# Patient Record
Sex: Male | Born: 2000
Health system: Southern US, Community
[De-identification: ages and names within clinical notes are randomized; demographics above are authoritative.]

## PROBLEM LIST (undated history)

## (undated) DIAGNOSIS — F639 Impulse disorder, unspecified: Secondary | ICD-10-CM

## (undated) DIAGNOSIS — R45851 Suicidal ideations: Secondary | ICD-10-CM

---
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
## 2018-07-09 ENCOUNTER — Emergency Department (HOSPITAL_COMMUNITY): Payer: Self-pay

## 2018-07-09 ENCOUNTER — Encounter (HOSPITAL_COMMUNITY): Payer: Self-pay | Admitting: *Deleted

## 2018-07-09 ENCOUNTER — Emergency Department (HOSPITAL_COMMUNITY)
Admission: EM | Admit: 2018-07-09 | Discharge: 2018-07-09 | Disposition: A | Discharge status: Home / Self Care | Payer: Self-pay | Attending: Emergency Medicine | Admitting: Emergency Medicine

## 2018-07-09 DIAGNOSIS — M546 Pain in thoracic spine: Secondary | ICD-10-CM | POA: Insufficient documentation

## 2018-07-09 DIAGNOSIS — F17210 Nicotine dependence, cigarettes, uncomplicated: Secondary | ICD-10-CM | POA: Insufficient documentation

## 2018-07-09 DIAGNOSIS — Z79899 Other long term (current) drug therapy: Secondary | ICD-10-CM | POA: Insufficient documentation

## 2018-07-09 MED ORDER — IBUPROFEN 800 MG PO TABS: 800.0000 mg | ORAL_TABLET | Freq: Three times a day (TID) | ORAL | 0 refills | Status: DC

## 2018-07-09 MED ORDER — METHOCARBAMOL 500 MG PO TABS: 500.0000 mg | ORAL_TABLET | Freq: Two times a day (BID) | ORAL | 0 refills | Status: DC
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
## 2018-07-09 NOTE — Discharge Instructions (Signed)
Take the prescribed medication as directed.  You may be sore for a few days which is normal.  Can use heat/ice as well to help with pain. Follow-up with your primary care doctor. Return to the ED for new or worsening symptoms.
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
## 2018-07-11 ENCOUNTER — Other Ambulatory Visit: Payer: Self-pay

## 2018-07-11 ENCOUNTER — Ambulatory Visit (HOSPITAL_COMMUNITY)
Admission: EM | Admit: 2018-07-11 | Discharge: 2018-07-11 | Disposition: A | Discharge status: Home / Self Care | Payer: Self-pay | Attending: Family Medicine | Admitting: Family Medicine

## 2018-07-11 ENCOUNTER — Encounter (HOSPITAL_COMMUNITY): Payer: Self-pay | Admitting: *Deleted

## 2018-07-11 DIAGNOSIS — M549 Dorsalgia, unspecified: Secondary | ICD-10-CM

## 2018-07-11 LAB — POCT I-STAT, CHEM 8
BUN: 18 mg/dL (ref 6–20)
CREATININE: 1.1 mg/dL (ref 0.61–1.24)
Calcium, Ion: 1.22 mmol/L (ref 1.15–1.40)
Chloride: 105 mmol/L (ref 98–111)
GLUCOSE: 74 mg/dL (ref 70–99)
HEMATOCRIT: 47 % (ref 39.0–52.0)
HEMOGLOBIN: 16 g/dL (ref 13.0–17.0)
Potassium: 4.1 mmol/L (ref 3.5–5.1)
Sodium: 139 mmol/L (ref 135–145)
TCO2: 23 mmol/L (ref 22–32)

## 2018-07-11 MED ORDER — KETOROLAC TROMETHAMINE 60 MG/2ML IM SOLN
INTRAMUSCULAR | Status: AC
  Filled 2018-07-11: qty 2

## 2018-07-11 MED ORDER — KETOROLAC TROMETHAMINE 60 MG/2ML IM SOLN
60.0000 mg | Freq: Once | INTRAMUSCULAR | Status: AC
  Administered 2018-07-11: 60 mg via INTRAMUSCULAR

## 2018-07-11 NOTE — Discharge Instructions (Signed)
Light and regular activity as tolerated. Avoid just laying or standing. Robaxin as needed as prescribed.  Ibuprofen regularly, don't take any additional for another 8 hours.  Limit heavy lifting for the next three days.  Please follow up with a primary care provider for persistent symptoms.  If develop increased pain, dizziness, abdominal pain, weakness, numbness or tingling please go to the Er.
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
## 2018-09-06 IMAGING — CR DG WRIST COMPLETE 3+V*R*
4 series · 4 of 4 positions shown · non-contrast
Comparison: None.

CLINICAL DATA: Fall on right hand playing basketball

EXAM:
RIGHT WRIST - COMPLETE 3+ VIEW

[wrist pa]
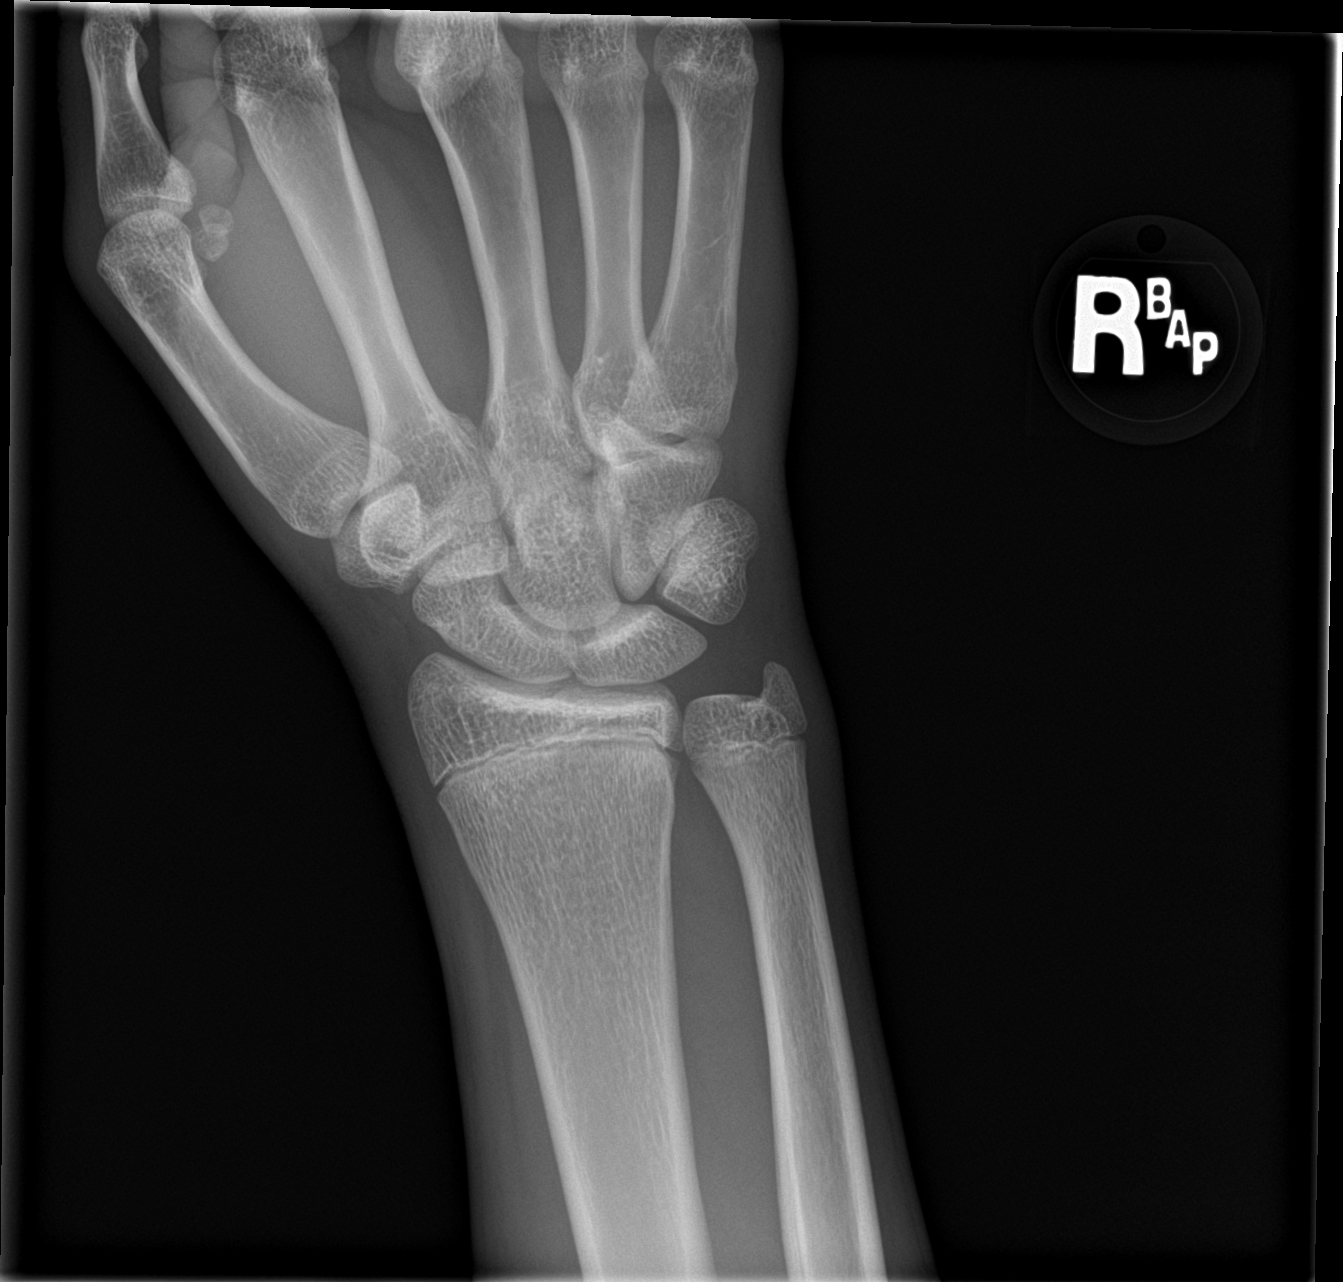

[wrist obl]
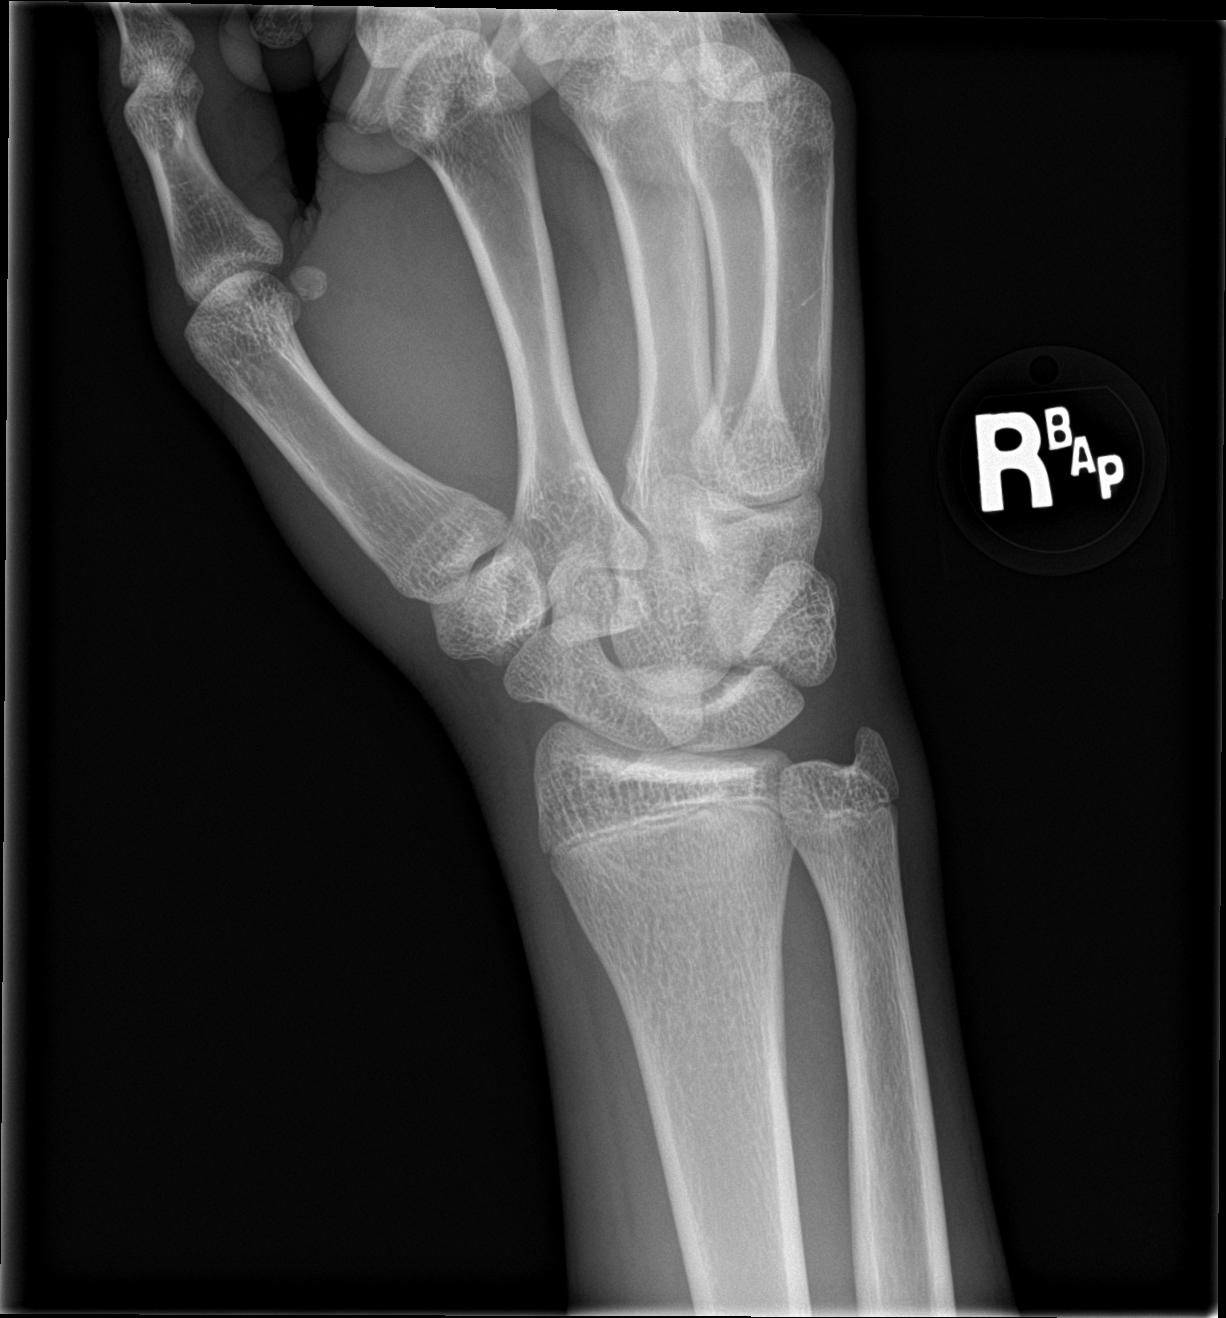

[wrist lat]
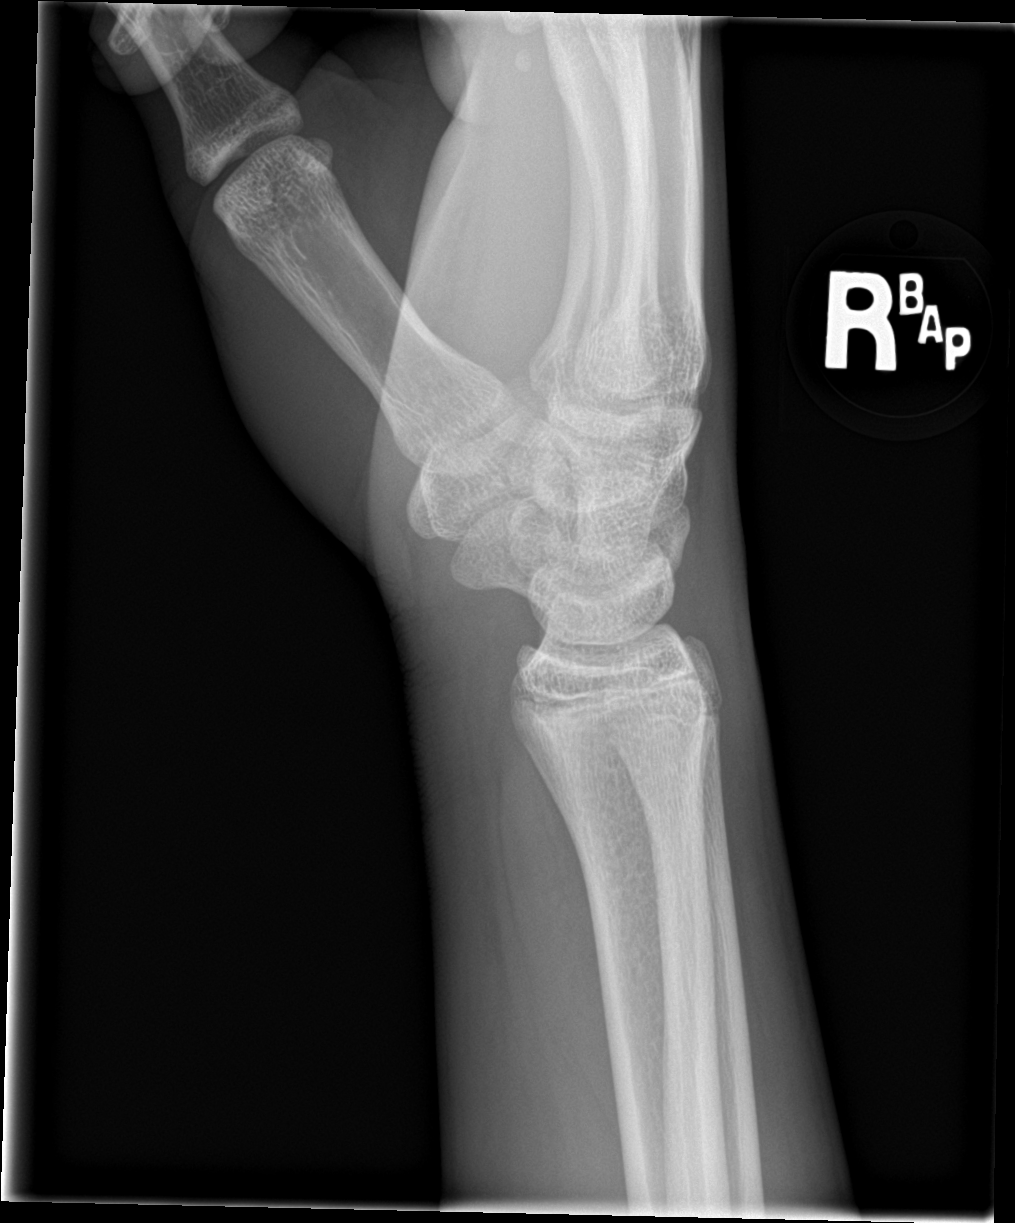

[wrist navicular]
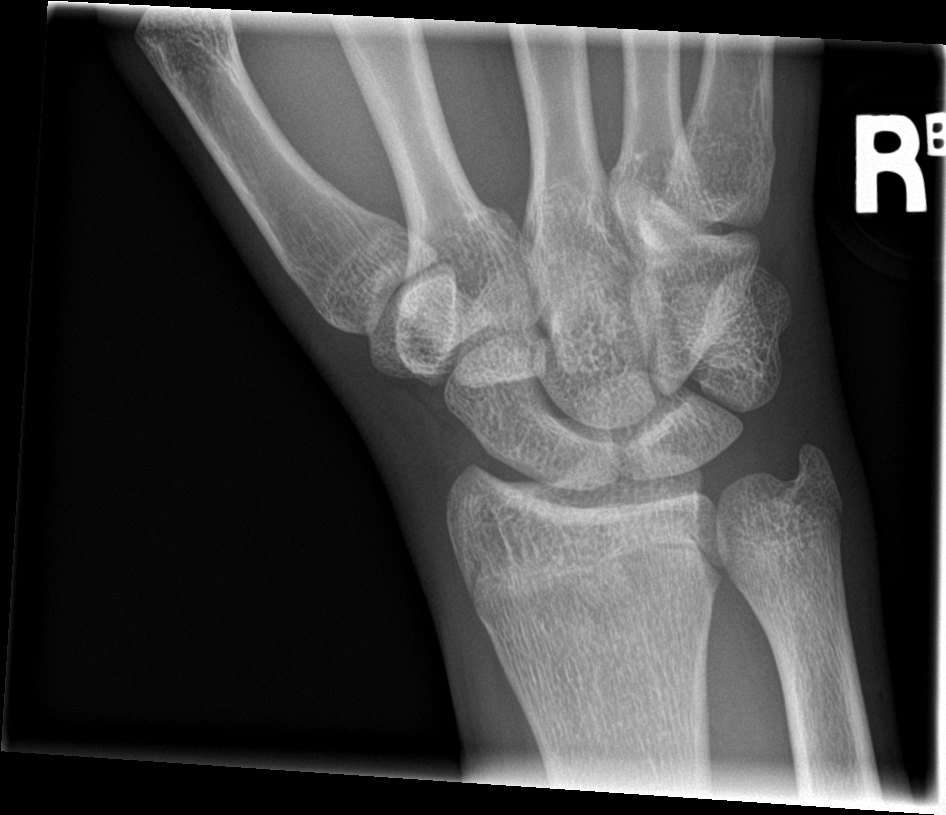

[4 of 4 positions shown; findings below may reference images not displayed]

FINDINGS: Four views of the right wrist submitted. No acute fracture or
subluxation. No radiopaque foreign body.
IMPRESSION: Negative.

## 2019-03-23 ENCOUNTER — Emergency Department (HOSPITAL_COMMUNITY): Payer: Self-pay

## 2019-03-23 ENCOUNTER — Other Ambulatory Visit: Payer: Self-pay

## 2019-03-23 ENCOUNTER — Encounter (HOSPITAL_COMMUNITY): Payer: Self-pay | Admitting: *Deleted

## 2019-03-23 ENCOUNTER — Emergency Department (HOSPITAL_COMMUNITY)
Admission: EM | Admit: 2019-03-23 | Discharge: 2019-03-23 | Disposition: A | Discharge status: Home / Self Care | Payer: Self-pay | Attending: Emergency Medicine | Admitting: Emergency Medicine

## 2019-03-23 DIAGNOSIS — Y999 Unspecified external cause status: Secondary | ICD-10-CM | POA: Insufficient documentation

## 2019-03-23 DIAGNOSIS — S82425A Nondisplaced transverse fracture of shaft of left fibula, initial encounter for closed fracture: Secondary | ICD-10-CM | POA: Insufficient documentation

## 2019-03-23 DIAGNOSIS — F17210 Nicotine dependence, cigarettes, uncomplicated: Secondary | ICD-10-CM | POA: Insufficient documentation

## 2019-03-23 DIAGNOSIS — Y92410 Unspecified street and highway as the place of occurrence of the external cause: Secondary | ICD-10-CM | POA: Insufficient documentation

## 2019-03-23 DIAGNOSIS — S81832A Puncture wound without foreign body, left lower leg, initial encounter: Secondary | ICD-10-CM | POA: Insufficient documentation

## 2019-03-23 DIAGNOSIS — Y9301 Activity, walking, marching and hiking: Secondary | ICD-10-CM | POA: Insufficient documentation

## 2019-03-23 MED ORDER — CEFAZOLIN SODIUM-DEXTROSE 1-4 GM/50ML-% IV SOLN
1.0000 g | Freq: Once | INTRAVENOUS | Status: AC
  Administered 2019-03-23: 1 g via INTRAVENOUS
  Filled 2019-03-23: qty 50

## 2019-03-23 MED ORDER — OXYCODONE-ACETAMINOPHEN 5-325 MG PO TABS
2.0000 | ORAL_TABLET | Freq: Once | ORAL | Status: AC
  Administered 2019-03-23: 2 via ORAL
  Filled 2019-03-23: qty 2

## 2019-03-23 MED ORDER — HYDROCODONE-ACETAMINOPHEN 5-325 MG PO TABS: 1.0000 | ORAL_TABLET | ORAL | 0 refills | Status: DC | PRN

## 2019-03-23 MED ORDER — CEPHALEXIN 500 MG PO CAPS: 500.0000 mg | ORAL_CAPSULE | Freq: Four times a day (QID) | ORAL | 0 refills | Status: AC
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
## 2020-02-06 DIAGNOSIS — Z03818 Encounter for observation for suspected exposure to other biological agents ruled out: Secondary | ICD-10-CM | POA: Diagnosis not present

## 2020-03-10 DIAGNOSIS — Z03818 Encounter for observation for suspected exposure to other biological agents ruled out: Secondary | ICD-10-CM | POA: Diagnosis not present

## 2020-03-24 DIAGNOSIS — Z03818 Encounter for observation for suspected exposure to other biological agents ruled out: Secondary | ICD-10-CM | POA: Diagnosis not present

## 2020-04-07 DIAGNOSIS — Z03818 Encounter for observation for suspected exposure to other biological agents ruled out: Secondary | ICD-10-CM | POA: Diagnosis not present

## 2020-04-21 DIAGNOSIS — Z03818 Encounter for observation for suspected exposure to other biological agents ruled out: Secondary | ICD-10-CM | POA: Diagnosis not present

## 2020-05-05 DIAGNOSIS — Z03818 Encounter for observation for suspected exposure to other biological agents ruled out: Secondary | ICD-10-CM | POA: Diagnosis not present

## 2020-05-19 DIAGNOSIS — Z03818 Encounter for observation for suspected exposure to other biological agents ruled out: Secondary | ICD-10-CM | POA: Diagnosis not present

## 2020-06-02 DIAGNOSIS — Z03818 Encounter for observation for suspected exposure to other biological agents ruled out: Secondary | ICD-10-CM | POA: Diagnosis not present

## 2020-12-06 ENCOUNTER — Other Ambulatory Visit: Payer: Self-pay

## 2020-12-06 ENCOUNTER — Encounter (HOSPITAL_COMMUNITY): Payer: Self-pay | Admitting: Emergency Medicine

## 2020-12-06 ENCOUNTER — Ambulatory Visit (HOSPITAL_COMMUNITY)
Admission: EM | Admit: 2020-12-06 | Discharge: 2020-12-06 | Disposition: A | Discharge status: Home / Self Care | Payer: Medicaid Other | Attending: Urgent Care | Admitting: Urgent Care

## 2020-12-06 ENCOUNTER — Ambulatory Visit
Admission: RE | Admit: 2020-12-06 | Discharge: 2020-12-06 | Disposition: A | Discharge status: Home / Self Care | Payer: Medicaid Other | Source: Ambulatory Visit

## 2020-12-06 DIAGNOSIS — Z202 Contact with and (suspected) exposure to infections with a predominantly sexual mode of transmission: Secondary | ICD-10-CM | POA: Insufficient documentation

## 2020-12-06 DIAGNOSIS — A749 Chlamydial infection, unspecified: Secondary | ICD-10-CM | POA: Diagnosis not present

## 2020-12-06 MED ORDER — CEFTRIAXONE SODIUM 500 MG IJ SOLR
INTRAMUSCULAR | Status: AC
  Filled 2020-12-06: qty 500

## 2020-12-06 MED ORDER — CEFTRIAXONE SODIUM 500 MG IJ SOLR
500.0000 mg | Freq: Once | INTRAMUSCULAR | Status: AC
  Administered 2020-12-06: 500 mg via INTRAMUSCULAR

## 2020-12-06 MED ORDER — DOXYCYCLINE HYCLATE 100 MG PO CAPS: 100.0000 mg | ORAL_CAPSULE | Freq: Two times a day (BID) | ORAL | 0 refills | Status: DC
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
## 2020-12-06 NOTE — Discharge Instructions (Signed)
Avoid all forms of sexual intercourse (oral, vaginal, anal) for the next 7 days to avoid spreading/reinfecting. Return if symptoms worsen/do not resolve, you develop fever, abdominal pain, blood in your urine, or are re-exposed to an STI.  

## 2020-12-06 NOTE — ED Provider Notes (Signed)
[facility]   MRN: [id number] DOB: Oct 24, 2000  Subjective:   Samuel Lowe is a 20 y.o. male presenting for treatment for chlamydia and gonorrhea.  Patient is sexually active with 1 male, states that she tested positive for gonorrhea and chlamydia.  Was negative for HIV and syphilis, trichomoniasis. Denies dysuria, hematuria, urinary frequency, penile discharge, penile swelling, testicular pain, testicular swelling, anal pain, groin pain.   No current facility-administered medications for this encounter.  Current Outpatient Medications:  .  acetaminophen (TYLENOL) 325 MG tablet, Take 2 tablets (650 mg total) by mouth every 4 (four) hours as needed for mild pain or moderate pain. (Patient not taking: Reported on 08/13/2016), Disp: 30 tablet, Rfl: 0 .  HYDROcodone-acetaminophen (NORCO/VICODIN) 5-325 MG tablet, Take 1 tablet by mouth every 4 (four) hours as needed., Disp: 10 tablet, Rfl: 0 .  hydrOXYzine (ATARAX/VISTARIL) 50 MG tablet, Take 1 tablet (50 mg total) by mouth at bedtime., Disp: 30 tablet, Rfl: 0 .  ibuprofen (ADVIL,MOTRIN) 800 MG tablet, Take 1 tablet (800 mg total) by mouth 3 (three) times daily., Disp: 21 tablet, Rfl: 0 .  methocarbamol (ROBAXIN) 500 MG tablet, Take 1 tablet (500 mg total) by mouth 2 (two) times daily., Disp: 20 tablet, Rfl: 0 .  risperiDONE (RISPERDAL) 1 MG tablet, Take 1 tablet (1 mg total) by mouth 2 (two) times daily., Disp: 60 tablet, Rfl: 0   No Known Allergies  Past Medical History:  Diagnosis Date  . Impulse control disorder in pediatric patient 08/15/2016  . Suicidal ideation 08/15/2016     History reviewed. No pertinent surgical history.  History reviewed. No pertinent family history.  Social History   Tobacco Use  . Smoking status: Current Every Day Smoker    Packs/day: 1.00  . Smokeless tobacco: Never Used  Substance Use Topics  . Alcohol use: No  . Drug use: Yes    Types: Marijuana     ROS   Objective:   Vitals: BP 100/62 (BP Location: Right Arm)   Pulse 96   Temp 98.7 F (37.1 C) (Oral)   Resp 16   SpO2 98%   Physical Exam Constitutional:      General: He is not in acute distress.    Appearance: Normal appearance. He is well-developed and normal weight. He is not ill-appearing, toxic-appearing or diaphoretic.  HENT:     Head: Normocephalic and atraumatic.     Right Ear: External ear normal.     Left Ear: External ear normal.     Nose: Nose normal.     Mouth/Throat:     Pharynx: Oropharynx is clear.  Eyes:     General: No scleral icterus.       Right eye: No discharge.        Left eye: No discharge.     Extraocular Movements: Extraocular movements intact.     Pupils: Pupils are equal, round, and reactive to light.  Cardiovascular:     Rate and Rhythm: Normal rate.  Pulmonary:     Effort: Pulmonary effort is normal.  Musculoskeletal:     Cervical back: Normal range of motion.  Neurological:     Mental Status: He is alert and oriented to person, place, and time.  Psychiatric:        Mood and Affect: Mood normal.        Behavior: Behavior normal.        Thought Content: Thought content normal.  Judgment: Judgment normal.     Assessment and Plan :   PDMP not reviewed this encounter.  1. Exposure to STD   2. Chlamydia infection   3. Gonorrhea contact     Patient treated empirically as per CDC guidelines with IM ceftriaxone, doxycycline as an outpatient.  Labs pending.   Counseled on safe sex practices including abstaining for 1 week following treatment.  Counseled patient on potential for adverse effects with medications prescribed/recommended today, ER and return-to-clinic precautions discussed, patient verbalized understanding.    Wallis Bamberg, [place] 12/06/20 1902

## 2020-12-07 LAB — CYTOLOGY, (ORAL, ANAL, URETHRAL) ANCILLARY ONLY
Chlamydia: POSITIVE — AB
Comment: NEGATIVE
Comment: NEGATIVE
Comment: NORMAL
Neisseria Gonorrhea: NEGATIVE
Trichomonas: NEGATIVE

## 2020-12-11 ENCOUNTER — Emergency Department (HOSPITAL_COMMUNITY)
Admission: EM | Admit: 2020-12-11 | Discharge: 2020-12-11 | Disposition: A | Discharge status: Home / Self Care | Payer: Medicaid Other | Attending: Emergency Medicine | Admitting: Emergency Medicine

## 2020-12-11 ENCOUNTER — Other Ambulatory Visit: Payer: Self-pay

## 2020-12-11 ENCOUNTER — Emergency Department (HOSPITAL_COMMUNITY): Payer: Medicaid Other

## 2020-12-11 ENCOUNTER — Encounter (HOSPITAL_COMMUNITY): Payer: Self-pay

## 2020-12-11 DIAGNOSIS — W28XXXA Contact with powered lawn mower, initial encounter: Secondary | ICD-10-CM | POA: Insufficient documentation

## 2020-12-11 DIAGNOSIS — S62632A Displaced fracture of distal phalanx of right middle finger, initial encounter for closed fracture: Secondary | ICD-10-CM | POA: Diagnosis not present

## 2020-12-11 DIAGNOSIS — Z23 Encounter for immunization: Secondary | ICD-10-CM | POA: Diagnosis not present

## 2020-12-11 DIAGNOSIS — S61312A Laceration without foreign body of right middle finger with damage to nail, initial encounter: Secondary | ICD-10-CM

## 2020-12-11 DIAGNOSIS — S61212A Laceration without foreign body of right middle finger without damage to nail, initial encounter: Secondary | ICD-10-CM | POA: Diagnosis not present

## 2020-12-11 DIAGNOSIS — F17200 Nicotine dependence, unspecified, uncomplicated: Secondary | ICD-10-CM | POA: Insufficient documentation

## 2020-12-11 DIAGNOSIS — S6991XA Unspecified injury of right wrist, hand and finger(s), initial encounter: Secondary | ICD-10-CM | POA: Diagnosis present

## 2020-12-11 DIAGNOSIS — S62632B Displaced fracture of distal phalanx of right middle finger, initial encounter for open fracture: Secondary | ICD-10-CM | POA: Insufficient documentation

## 2020-12-11 MED ORDER — NAPROXEN 500 MG PO TABS: 500.0000 mg | ORAL_TABLET | Freq: Two times a day (BID) | ORAL | 0 refills | Status: DC

## 2020-12-11 MED ORDER — TETANUS-DIPHTH-ACELL PERTUSSIS 5-2.5-18.5 LF-MCG/0.5 IM SUSY
0.5000 mL | PREFILLED_SYRINGE | Freq: Once | INTRAMUSCULAR | Status: AC
  Administered 2020-12-11: 0.5 mL via INTRAMUSCULAR
  Filled 2020-12-11: qty 0.5

## 2020-12-11 MED ORDER — LIDOCAINE HCL (PF) 1 % IJ SOLN
5.0000 mL | Freq: Once | INTRAMUSCULAR | Status: AC
  Administered 2020-12-11: 5 mL via INTRADERMAL
  Filled 2020-12-11: qty 30

## 2020-12-11 MED ORDER — CEPHALEXIN 500 MG PO CAPS: 500.0000 mg | ORAL_CAPSULE | Freq: Three times a day (TID) | ORAL | 0 refills | Status: AC
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
## 2020-12-11 NOTE — Discharge Instructions (Signed)
Please call the office for Dr. Orlan Leavens on Monday morning, he will see you early in the week.  Keep your wound clean and dry, do not get it wet,  Ibuprofen or naproxen for pain, cephalexin 3 times daily to help prevent infection  Return to the ER for severe worsening pain swelling fever pus or drainage.

## 2020-12-11 NOTE — ED Triage Notes (Signed)
Right hand, middle finger partial nail avulsion after catching finger in lawn mower. Bleeding controlled. Nail still in place.

## 2020-12-11 NOTE — ED Provider Notes (Signed)
[facility] EMERGENCY DEPARTMENT Provider Note   CSN: [id number] Arrival date & time: 12/11/20  1126     History Chief Complaint  Patient presents with  . Finger Injury    Samuel Lowe is a 20 y.o. male.  HPI   This patient is a 20 year old male, presents after having a accidental injury to the right finger, states that his finger was caught in a lawnmower, and injured the nail and caused a laceration, he is not up-to-date on tetanus, this occurred just prior to arrival, symptoms are persistent, worse with moving the finger, associated with bleeding which is now stopped.  He has had no medication prior to arrival.  Past Medical History:  Diagnosis Date  . Impulse control disorder in pediatric patient 08/15/2016  . Suicidal ideation 08/15/2016    Patient Active Problem List   Diagnosis Date Noted  . MDD (major depressive disorder) 08/16/2016  . Impulse control disorder in pediatric patient 08/15/2016  . Suicidal ideation 08/15/2016  . MDD (major depressive disorder), recurrent episode, moderate (HCC) 08/14/2016    History reviewed. No pertinent surgical history.     History reviewed. No pertinent family history.  Social History   Tobacco Use  . Smoking status: Current Every Day Smoker    Packs/day: 1.00  . Smokeless tobacco: Never Used  Substance Use Topics  . Alcohol use: No  . Drug use: Yes    Types: Marijuana    Home Medications Prior to Admission medications   Medication Sig Start Date End Date Taking? Authorizing Provider  cephALEXin (KEFLEX) 500 MG capsule Take 1 capsule (500 mg total) by mouth 3 (three) times daily for 7 days. 12/11/20 12/18/20 Yes Eber Hong, MD  naproxen (NAPROSYN) 500 MG tablet Take 1 tablet (500 mg total) by mouth 2 (two) times daily with a meal. 12/11/20  Yes Eber Hong, MD  acetaminophen (TYLENOL) 325 MG tablet Take 2 tablets (650 mg total) by mouth every 4 (four) hours as needed for mild pain or moderate pain. Patient  not taking: Reported on 08/13/2016 05/21/16   Sherrilee Gilles, NP  doxycycline (VIBRAMYCIN) 100 MG capsule Take 1 capsule (100 mg total) by mouth 2 (two) times daily. 12/06/20   Wallis Bamberg, PA-C  HYDROcodone-acetaminophen (NORCO/VICODIN) 5-325 MG tablet Take 1 tablet by mouth every 4 (four) hours as needed. 03/23/19   Elson Areas, PA-C  hydrOXYzine (ATARAX/VISTARIL) 50 MG tablet Take 1 tablet (50 mg total) by mouth at bedtime. 08/21/16   Thedora Hinders, MD  ibuprofen (ADVIL,MOTRIN) 800 MG tablet Take 1 tablet (800 mg total) by mouth 3 (three) times daily. 07/09/18   Garlon Hatchet, PA-C  methocarbamol (ROBAXIN) 500 MG tablet Take 1 tablet (500 mg total) by mouth 2 (two) times daily. 07/09/18   Garlon Hatchet, PA-C  risperiDONE (RISPERDAL) 1 MG tablet Take 1 tablet (1 mg total) by mouth 2 (two) times daily. 08/21/16   Thedora Hinders, MD    Allergies    Patient has no known allergies.  Review of Systems   Review of Systems  Constitutional: Negative for fever.  Gastrointestinal: Negative for vomiting.  Skin: Positive for wound.       Laceration  Neurological: Negative for weakness and numbness.  Hematological: Does not bruise/bleed easily.    Physical Exam Updated Vital Signs BP 128/88 (BP Location: Left Arm)   Pulse 68   Temp 98.6 F (37 C) (Oral)   Resp 17   Ht 1.88 m (6\' 2" )  Wt 72.6 kg   SpO2 99%   BMI 20.54 kg/m   Physical Exam Vitals and nursing note reviewed.  Constitutional:      Appearance: He is well-developed. He is not diaphoretic.  HENT:     Head: Normocephalic and atraumatic.  Eyes:     General:        Right eye: No discharge.        Left eye: No discharge.     Conjunctiva/sclera: Conjunctivae normal.  Pulmonary:     Effort: Pulmonary effort is normal. No respiratory distress.  Musculoskeletal:     Comments: The patient's right middle finger has a laceration on the edge of the finger at the nail bed, the proximal nail has  come out of the nail fold, there is minimal bleeding  Skin:    General: Skin is warm and dry.     Findings: No erythema or rash.  Neurological:     Mental Status: He is alert.     Coordination: Coordination normal.     ED Results / Procedures / Treatments   Labs (all labs ordered are listed, but only abnormal results are displayed) Labs Reviewed - No data to display  EKG None  Radiology DG Finger Middle Right  Result Date: 12/11/2020 CLINICAL DATA:  Lawnmower injury. Partial nail avulsion of the middle finger. EXAM: RIGHT MIDDLE FINGER 2+V COMPARISON:  None. FINDINGS: There is an open, mildly displaced and mildly comminuted fracture through the distal tuft of the right long finger. This fracture demonstrates no intra-articular extension. There is associated soft tissue injury without foreign body or soft tissue emphysema. IMPRESSION: Open, mildly comminuted extra-articular fracture of the distal 3rd phalanx. Electronically Signed   By: Carey Bullocks M.D.   On: 12/11/2020 12:33    Procedures .Marland KitchenLaceration Repair  Date/Time: 12/11/2020 2:27 PM Performed by: Eber Hong, MD Authorized by: Eber Hong, MD   Consent:    Consent obtained:  Verbal   Consent given by:  Patient   Risks discussed:  Infection, pain, need for additional repair, poor cosmetic result and poor wound healing   Alternatives discussed:  No treatment and delayed treatment Universal protocol:    Procedure explained and questions answered to patient or proxy's satisfaction: yes     Relevant documents present and verified: yes     Test results available: yes     Imaging studies available: yes     Required blood products, implants, devices, and special equipment available: yes     Site/side marked: yes     Immediately prior to procedure, a time out was called: yes     Patient identity confirmed:  Verbally with patient and arm band Anesthesia:    Anesthesia method:  Nerve block   Block needle gauge:  25 G    Block anesthetic:  Lidocaine 1% WITH epi   Block technique:  Digital   Block injection procedure:  Anatomic landmarks identified, anatomic landmarks palpated, introduced needle, negative aspiration for blood and incremental injection   Block outcome:  Anesthesia achieved Laceration details:    Location:  Finger   Finger location:  R long finger   Length (cm):  2.6   Depth (mm):  5 Pre-procedure details:    Preparation:  Patient was prepped and draped in usual sterile fashion and imaging obtained to evaluate for foreign bodies Exploration:    Limited defect created (wound extended): no     Hemostasis achieved with:  Direct pressure   Imaging outcome: foreign body not noted  Wound exploration: wound explored through full range of motion and entire depth of wound visualized     Wound extent: no fascia violation noted, no foreign bodies/material noted, no muscle damage noted, no nerve damage noted, no tendon damage noted, no underlying fracture noted and no vascular damage noted     Contaminated: no   Treatment:    Area cleansed with:  Betadine and povidone-iodine   Amount of cleaning:  Standard   Irrigation solution:  Sterile saline   Irrigation volume:  1000   Irrigation method:  Syringe   Visualized foreign bodies/material removed: no     Debridement:  None Skin repair:    Repair method:  Sutures   Suture size:  5-0   Suture material:  Prolene   Suture technique:  Simple interrupted   Number of sutures:  3 Approximation:    Approximation:  Loose Repair type:    Repair type:  Intermediate Post-procedure details:    Dressing:  Antibiotic ointment and sterile dressing   Procedure completion:  Tolerated well, no immediate complications Comments:       .Splint Application  Date/Time: 12/11/2020 2:29 PM Performed by: Eber Hong, MD Authorized by: Eber Hong, MD   Consent:    Consent obtained:  Verbal   Consent given by:  Patient   Risks, benefits, and alternatives  were discussed: yes   Universal protocol:    Patient identity confirmed:  Verbally with patient Pre-procedure details:    Distal neurologic exam:  Normal   Distal perfusion: distal pulses strong   Procedure details:    Location:  Finger   Finger location:  R long finger   Splint type:  Finger   Supplies:  Aluminum splint   Attestation: Splint applied and adjusted personally by me   Post-procedure details:    Distal neurologic exam:  Normal   Distal perfusion: distal pulses strong     Procedure completion:  Tolerated well, no immediate complications Comments:           Medications Ordered in ED Medications  lidocaine (PF) (XYLOCAINE) 1 % injection 5 mL (5 mLs Intradermal Given by Other 12/11/20 1241)  Tdap (BOOSTRIX) injection 0.5 mL (0.5 mLs Intramuscular Given 12/11/20 1241)    ED Course  I have reviewed the triage vital signs and the nursing notes.  Pertinent labs & imaging results that were available during my care of the patient were reviewed by me and considered in my medical decision making (see chart for details).  Clinical Course as of 12/12/20 1126  Sat Dec 11, 2020  1401 D/w Dr. Derry Lory at 2 PM - agrees with f/u in the office - cleaning it and keflex and "tack" together - loose closure  I have personally seen images, has fracture - comminuted - distal phalanx - extra articular [BM]    Clinical Course User Index [BM] Eber Hong, MD   MDM Rules/Calculators/A&P                          Update tetanus, image for looking for fractures and foreign bodies, digital block, irrigate, repair as needed, follow-up outpatient.  Patient agreeable    Final Clinical Impression(s) / ED Diagnoses Final diagnoses:  Laceration of right middle finger without foreign body with damage to nail, initial encounter  Open displaced fracture of distal phalanx of right middle finger, initial encounter    Rx / DC Orders ED Discharge Orders  Ordered    naproxen (NAPROSYN)  500 MG tablet  2 times daily with meals        12/11/20 1431    cephALEXin (KEFLEX) 500 MG capsule  3 times daily        12/11/20 1431           Eber HongMiller, Briar Sword, MD 12/12/20 1126

## 2020-12-11 NOTE — ED Notes (Signed)
Dressing applied to finger per MD order. Explained to pt how to keep wound and dressing dry and clean. He is aware to call specialist's office Monday to make a f/u appointment. Explained s/s to watch for in case he needs to return. No additional questions at discharge. Pt unable to sign electronic pad due to trauma to his hand.

## 2020-12-13 ENCOUNTER — Telehealth: Payer: Self-pay

## 2020-12-13 NOTE — Telephone Encounter (Signed)
Transition Care Management Unsuccessful Follow-up Telephone Call  Date of discharge and from where:  12/11/2020 from [facility]  Attempts:  1st Attempt  Reason for unsuccessful TCM follow-up call:  Left voice message     

## 2020-12-14 NOTE — Telephone Encounter (Signed)
Transition Care Management Follow-up Telephone Call  Date of discharge and from where: 12/11/2020 from [facility]  How have you been since you were released from the hospital? Pt states that he is feeling well and has started him medication rx'ed by the ED.   Any questions or concerns? No  Items Reviewed:  Did the pt receive and understand the discharge instructions provided? Yes   Medications obtained and verified? Yes   Other? No   Any new allergies since your discharge? No   Dietary orders reviewed? n/a  Do you have support at home? Yes   Functional Questionnaire: (I = Independent and D = Dependent) ADLs: I  Bathing/Dressing- I  Meal Prep- I  Eating- I  Maintaining continence- I  Transferring/Ambulation- I  Managing Meds- I  Follow up appointments reviewed:   PCP Hospital f/u appt confirmed? No  .  Specialist Hospital f/u appt confirmed? Yes  Pt has an appt with Ortho tomorrow.   Are transportation arrangements needed? No   If their condition worsens, is the pt aware to call PCP or go to the Emergency Dept.? Yes  Was the patient provided with contact information for the PCP's office or ED? Yes  Was to pt encouraged to call back with questions or concerns? Yes

## 2020-12-17 DIAGNOSIS — S62632B Displaced fracture of distal phalanx of right middle finger, initial encounter for open fracture: Secondary | ICD-10-CM | POA: Diagnosis not present

## 2020-12-23 DIAGNOSIS — S62632B Displaced fracture of distal phalanx of right middle finger, initial encounter for open fracture: Secondary | ICD-10-CM | POA: Diagnosis not present

## 2021-01-13 DIAGNOSIS — S62632B Displaced fracture of distal phalanx of right middle finger, initial encounter for open fracture: Secondary | ICD-10-CM | POA: Diagnosis not present

## 2021-02-10 DIAGNOSIS — S62632B Displaced fracture of distal phalanx of right middle finger, initial encounter for open fracture: Secondary | ICD-10-CM | POA: Diagnosis not present

## 2021-02-21 DIAGNOSIS — Z20822 Contact with and (suspected) exposure to covid-19: Secondary | ICD-10-CM | POA: Diagnosis not present

## 2021-02-21 DIAGNOSIS — U071 COVID-19: Secondary | ICD-10-CM | POA: Diagnosis not present

## 2021-07-20 DIAGNOSIS — M5451 Vertebrogenic low back pain: Secondary | ICD-10-CM | POA: Diagnosis not present

## 2021-07-20 DIAGNOSIS — G894 Chronic pain syndrome: Secondary | ICD-10-CM | POA: Diagnosis not present

## 2021-08-20 DIAGNOSIS — M5451 Vertebrogenic low back pain: Secondary | ICD-10-CM | POA: Diagnosis not present

## 2021-08-20 DIAGNOSIS — G894 Chronic pain syndrome: Secondary | ICD-10-CM | POA: Diagnosis not present

## 2024-02-12 ENCOUNTER — Ambulatory Visit
Admission: EM | Admit: 2024-02-12 | Discharge: 2024-02-12 | Discharge status: Against Medical Advice | Attending: Nurse Practitioner | Admitting: Nurse Practitioner

## 2024-02-12 ENCOUNTER — Encounter: Payer: Self-pay | Admitting: Emergency Medicine

## 2024-02-12 DIAGNOSIS — T63441A Toxic effect of venom of bees, accidental (unintentional), initial encounter: Secondary | ICD-10-CM

## 2024-02-12 DIAGNOSIS — R1110 Vomiting, unspecified: Secondary | ICD-10-CM

## 2024-02-12 DIAGNOSIS — M545 Low back pain, unspecified: Secondary | ICD-10-CM

## 2024-02-12 MED ORDER — DEXAMETHASONE SODIUM PHOSPHATE 10 MG/ML IJ SOLN: 10.0000 mg | INTRAMUSCULAR | Status: DC

## 2024-02-12 NOTE — ED Provider Notes (Signed)
 [facility]    CSN: [id number] Arrival date & time: 02/12/24  1532      History   Chief Complaint No chief complaint on file.   HPI Samuel Lowe is a 23 y.o. male.   The history is provided by the patient.   Patient presents for complaints of right-sided low back pain and intermittent nausea and vomiting that has been present since he was stung on his right foot by a yellowjacket 3 days ago.  Patient states after the yellowjacket sting, he had pain and swelling in the right foot.  He states that has since improved.  He states he has vomited approximately 2 times since his symptoms started.  He states that the pain in his back comes and goes.  He describes the pain as "sharp."  States that the pain worsened today which is why he came in to be evaluated.  Patient states he did take 1 Benadryl after the bee sting.  He states that he is highly allergic to certain types of bee stings.  Patient states that he does not carry an EpiPen.  Past Medical History:  Diagnosis Date   Impulse control disorder in pediatric patient 08/15/2016   Suicidal ideation 08/15/2016    Patient Active Problem List   Diagnosis Date Noted   MDD (major depressive disorder) 08/16/2016   Impulse control disorder in pediatric patient 08/15/2016   Suicidal ideation 08/15/2016   MDD (major depressive disorder), recurrent episode, moderate (HCC) 08/14/2016    History reviewed. No pertinent surgical history.     Home Medications    Prior to Admission medications   Not on File    Family History History reviewed. No pertinent family history.  Social History Social History   Tobacco Use   Smoking status: Every Day    Current packs/day: 1.00    Types: Cigarettes   Smokeless tobacco: Never  Vaping Use   Vaping status: Never Used  Substance Use Topics   Alcohol use: No   Drug use: Yes    Types: Marijuana     Allergies   Patient has no known allergies.   Review of  Systems Review of Systems Per HPI  Physical Exam Triage Vital Signs ED Triage Vitals  Encounter Vitals Group     BP 02/12/24 1544 108/63     Systolic BP Percentile --      Diastolic BP Percentile --      Pulse Rate 02/12/24 1544 (!) 104     Resp 02/12/24 1544 18     Temp 02/12/24 1544 98.2 F (36.8 C)     Temp Source 02/12/24 1544 Oral     SpO2 02/12/24 1544 95 %     Weight --      Height --      Head Circumference --      Peak Flow --      Pain Score 02/12/24 1545 0     Pain Loc --      Pain Education --      Exclude from Growth Chart --    No data found.  Updated Vital Signs BP 108/63 (BP Location: Right Arm)   Pulse (!) 104   Temp 98.2 F (36.8 C) (Oral)   Resp 18   SpO2 95%   Visual Acuity Right Eye Distance:   Left Eye Distance:   Bilateral Distance:    Right Eye Near:   Left Eye Near:    Bilateral Near:  Physical Exam Vitals and nursing note reviewed.  Constitutional:      General: He is not in acute distress.    Appearance: Normal appearance.  HENT:     Head: Normocephalic.  Eyes:     Extraocular Movements: Extraocular movements intact.     Pupils: Pupils are equal, round, and reactive to light.  Cardiovascular:     Rate and Rhythm: Normal rate and regular rhythm.     Pulses: Normal pulses.     Heart sounds: Normal heart sounds.  Pulmonary:     Effort: Pulmonary effort is normal.     Breath sounds: Normal breath sounds.  Abdominal:     General: Bowel sounds are normal.     Palpations: Abdomen is soft.     Tenderness: There is no abdominal tenderness. There is no right CVA tenderness or left CVA tenderness.  Musculoskeletal:     Cervical back: Normal range of motion.  Skin:    General: Skin is warm and dry.  Neurological:     General: No focal deficit present.     Mental Status: He is alert and oriented to person, place, and time.  Psychiatric:        Mood and Affect: Mood normal.        Behavior: Behavior normal.      UC  Treatments / Results  Labs (all labs ordered are listed, but only abnormal results are displayed) Labs Reviewed - No data to display  EKG   Radiology No results found.  Procedures Procedures (including critical care time)  Medications Ordered in UC Medications - No data to display  Initial Impression / Assessment and Plan / UC Course  I have reviewed the triage vital signs and the nursing notes.  Pertinent labs & imaging results that were available during my care of the patient were reviewed by me and considered in my medical decision making (see chart for details).  Attempted to obtain urinalysis from the patient and administer Decadron injection.  Patient stepped out of the room, stating, "I need to be somewhere."  Patient was urged to stay to complete visit.  Patient states that he needed to leave.  Patient ambulatory at discharge, vital signs were stable.  Patient left AMA.  Final Clinical Impressions(s) / UC Diagnoses   Final diagnoses:  Right-sided low back pain without sciatica, unspecified chronicity  Vomiting, unspecified vomiting type, unspecified whether nausea present  Bee sting, accidental or unintentional, initial encounter     Discharge Instructions      Patient left AMA.   ED Prescriptions   None    PDMP not reviewed this encounter.   Hardy Lia, NP 02/12/24 1824

## 2024-02-12 NOTE — ED Triage Notes (Signed)
 Stung by yellow jackets on Friday on right foot.  States he has been having stabbing back pain and vomiting and thinks this is related to the bee stings.

## 2024-02-12 NOTE — ED Notes (Signed)
 Pt came out of his room and stated that he cannot stay. States "if it is not an allergic reaction then it is nothing that he cares about"

## 2024-02-12 NOTE — Discharge Instructions (Signed)
 Patient left AMA.
